# Patient Record
Sex: Male | Born: 2000 | Race: Black or African American | Hispanic: No | Marital: Single | Smoking: Never smoker
Health system: Southern US, Community
[De-identification: ages and names within clinical notes are randomized; demographics above are authoritative.]

## PROBLEM LIST (undated history)

## (undated) DIAGNOSIS — J302 Other seasonal allergic rhinitis: Secondary | ICD-10-CM

## (undated) HISTORY — PX: ADENOIDECTOMY: SUR15

## (undated) HISTORY — PX: OTHER SURGICAL HISTORY: SHX169

---
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
## 2020-12-07 ENCOUNTER — Encounter (HOSPITAL_COMMUNITY): Payer: Self-pay

## 2020-12-07 ENCOUNTER — Other Ambulatory Visit: Payer: Self-pay

## 2020-12-07 ENCOUNTER — Emergency Department (HOSPITAL_COMMUNITY)
Admission: EM | Admit: 2020-12-07 | Discharge: 2020-12-07 | Disposition: A | Discharge status: Home / Self Care | Payer: Medicaid Other | Attending: Emergency Medicine | Admitting: Emergency Medicine

## 2020-12-07 DIAGNOSIS — Z5321 Procedure and treatment not carried out due to patient leaving prior to being seen by health care provider: Secondary | ICD-10-CM | POA: Diagnosis not present

## 2020-12-07 DIAGNOSIS — K0889 Other specified disorders of teeth and supporting structures: Secondary | ICD-10-CM | POA: Diagnosis present

## 2020-12-07 NOTE — ED Triage Notes (Signed)
Patient c/o right upper dental pain x 3 days. No facial swelling.

## 2020-12-07 NOTE — ED Notes (Signed)
Pt ambulatory in ED lobby. 

## 2021-12-07 ENCOUNTER — Ambulatory Visit
Admission: EM | Admit: 2021-12-07 | Discharge: 2021-12-07 | Disposition: A | Discharge status: Home / Self Care | Payer: Medicaid Other | Attending: Physician Assistant | Admitting: Physician Assistant

## 2021-12-07 DIAGNOSIS — K0889 Other specified disorders of teeth and supporting structures: Secondary | ICD-10-CM

## 2021-12-07 DIAGNOSIS — K047 Periapical abscess without sinus: Secondary | ICD-10-CM | POA: Diagnosis not present

## 2021-12-07 MED ORDER — PENICILLIN V POTASSIUM 500 MG PO TABS: 500.0000 mg | ORAL_TABLET | Freq: Four times a day (QID) | ORAL | 0 refills | Status: AC

## 2021-12-07 MED ORDER — IBUPROFEN 600 MG PO TABS: 600.0000 mg | ORAL_TABLET | Freq: Four times a day (QID) | ORAL | 0 refills | Status: DC | PRN

## 2021-12-07 NOTE — ED Provider Notes (Signed)
[facility]    CSN: [id number] Arrival date & time: 12/07/21  1501      History   Chief Complaint Chief Complaint  Patient presents with   Dental Pain    HPI Dayron Odland is a 21 y.o. male.   21 year old male presents with tooth pain.  He Clearence Cheek for the past several days he has been having a right lower tooth pain, discomfort which is worse when he drinks hot and cold liquids.  He also relates he has pain when he tries to chew on the right side.  He indicates he feels like that the right side of his face is swelling.  He has been taking some BC powder like medication to give relief from the right tooth pain but this has not worked.  He relates he has not have any fever, chills, and no nausea or vomiting.   Dental Pain   Past Medical History:  Diagnosis Date   Asthma    Seasonal allergies     There are no problems to display for this patient.   Past Surgical History:  Procedure Laterality Date   ADENOIDECTOMY     tubes in ears         Home Medications    Prior to Admission medications   Medication Sig Start Date End Date Taking? Authorizing Provider  ibuprofen (ADVIL) 600 MG tablet Take 1 tablet (600 mg total) by mouth every 6 (six) hours as needed. 12/07/21  Yes Nyoka Lint, PA-C  penicillin v potassium (VEETID) 500 MG tablet Take 1 tablet (500 mg total) by mouth 4 (four) times daily for 10 days. 12/07/21 12/17/21 Yes Nyoka Lint, PA-C  albuterol (PROVENTIL HFA;VENTOLIN HFA) 108 (90 BASE) MCG/ACT inhaler Inhale 2 puffs into the lungs every 6 (six) hours as needed. For wheezing    [provider]  ibuprofen (ADVIL,MOTRIN) 400 MG tablet Take 1 tablet (400 mg total) by mouth every 6 (six) hours as needed for mild pain or moderate pain. 07/09/16   Waynetta Pean, PA-C  oseltamivir (TAMIFLU) 75 MG capsule Take 1 capsule (75 mg total) by mouth every 12 (twelve) hours. 03/31/17   Recardo Evangelist, PA-C    Family History Family History   Problem Relation Age of Onset   Asthma Mother     Social History Social History   Tobacco Use   Smoking status: Never   Smokeless tobacco: Never  Vaping Use   Vaping Use: Never used  Substance Use Topics   Alcohol use: No   Drug use: No     Allergies   Patient has no known allergies.   Review of Systems Review of Systems  HENT:  Positive for dental problem (right lower).      Physical Exam Triage Vital Signs ED Triage Vitals [12/07/21 1541]  Enc Vitals Group     BP 123/85     Pulse Rate 75     Resp 16     Temp 98 F (36.7 C)     Temp Source Oral     SpO2 98 %     Weight      Height      Head Circumference      Peak Flow      Pain Score 4     Pain Loc      Pain Edu?      Excl. in [place]?    No data found.  Updated Vital Signs BP 123/85 (BP Location: Left Arm)   Pulse 75  Temp 98 F (36.7 C) (Oral)   Resp 16   SpO2 98%   Visual Acuity Right Eye Distance:   Left Eye Distance:   Bilateral Distance:    Right Eye Near:   Left Eye Near:    Bilateral Near:     Physical Exam Constitutional:      Appearance: Normal appearance.  HENT:     Mouth/Throat:      Comments: Mouth: Tenderness is palpated along the wisdom tooth and next molar.  The wisdom tooth is encroaching on the space of the molar minimal swelling is present no active discharge noted.  Patient has pain on palpation of cheek area adjacent to the suspected tooth problem. Neurological:     Mental Status: He is alert.      UC Treatments / Results  Labs (all labs ordered are listed, but only abnormal results are displayed) Labs Reviewed - No data to display  EKG   Radiology No results found.  Procedures Procedures (including critical care time)  Medications Ordered in UC Medications - No data to display  Initial Impression / Assessment and Plan / UC Course  I have reviewed the triage vital signs and the nursing notes.  Pertinent labs & imaging results that were available  during my care of the patient were reviewed by me and considered in my medical decision making (see chart for details).    Plan: 1.  The dental abscess will be treated with the following: A.  Pen-Vee K 500 mg every 6 hours to treat infection. 2.  The dental pain will be treated with the following: A.  Ibuprofen 600 mg every 8 hours with food to help reduce pain. 3.  Patient advised to call [facility] dental services deceiving get an appointment to be evaluated. 4.  Advised follow-up PCP or return to urgent care if symptoms fail to improve. Final Clinical Impressions(s) / UC Diagnoses   Final diagnoses:  Pain, dental  Dental abscess     Discharge Instructions      Advised take ibuprofen 600 mg every 8 hours with food to help reduce pain. Advised take the Pen-Vee K 500 mg 4 times a day to treat possible infection. Advised to contact [facility] dental services to see if he get an appointment to see the dentist and have the pain and tooth addressed. Advised to follow-up with PCP or return to urgent care if symptoms fail to improve.    ED Prescriptions     Medication Sig Dispense Auth. Provider   penicillin v potassium (VEETID) 500 MG tablet Take 1 tablet (500 mg total) by mouth 4 (four) times daily for 10 days. 40 tablet Ellsworth Lennox, PA-C   ibuprofen (ADVIL) 600 MG tablet Take 1 tablet (600 mg total) by mouth every 6 (six) hours as needed. 30 tablet Ellsworth Lennox, PA-C      PDMP not reviewed this encounter.   Ellsworth Lennox, PA-C 12/07/21 1556

## 2021-12-07 NOTE — ED Triage Notes (Signed)
Pt c/o bottom right toothache onset ~ last night.

## 2021-12-07 NOTE — Discharge Instructions (Addendum)
Advised take ibuprofen 600 mg every 8 hours with food to help reduce pain. Advised take the Pen-Vee K 500 mg 4 times a day to treat possible infection. Advised to contact [facility] dental services to see if he get an appointment to see the dentist and have the pain and tooth addressed. Advised to follow-up with PCP or return to urgent care if symptoms fail to improve.

## 2022-01-26 ENCOUNTER — Ambulatory Visit
Admission: EM | Admit: 2022-01-26 | Discharge: 2022-01-26 | Disposition: A | Discharge status: Home / Self Care | Payer: Medicaid Other | Attending: Nurse Practitioner | Admitting: Nurse Practitioner

## 2022-01-26 DIAGNOSIS — Z1152 Encounter for screening for COVID-19: Secondary | ICD-10-CM | POA: Insufficient documentation

## 2022-01-26 DIAGNOSIS — R519 Headache, unspecified: Secondary | ICD-10-CM | POA: Insufficient documentation

## 2022-01-26 DIAGNOSIS — B349 Viral infection, unspecified: Secondary | ICD-10-CM | POA: Diagnosis not present

## 2022-01-26 DIAGNOSIS — J45909 Unspecified asthma, uncomplicated: Secondary | ICD-10-CM | POA: Insufficient documentation

## 2022-01-26 DIAGNOSIS — R058 Other specified cough: Secondary | ICD-10-CM | POA: Insufficient documentation

## 2022-01-26 LAB — RESP PANEL BY RT-PCR (FLU A&B, COVID) ARPGX2
Influenza A by PCR: NEGATIVE
Influenza B by PCR: NEGATIVE
SARS Coronavirus 2 by RT PCR: NEGATIVE

## 2022-01-26 NOTE — ED Provider Notes (Signed)
[facility]    CSN: [id number] Arrival date & time: 01/26/22  1528      History   Chief Complaint Chief Complaint  Patient presents with   Cough    HPI Troy Fuentes is a 21 y.o. male who presents for evaluation of cough. Pt reports one day of cough with HA, ST, and congestion.  He denies fevers, nausea/vomiting/diarrhea, shortness of breath.  He has a history of asthma.  Denies any wheezing or shortness of breath since onset of symptoms.  He does have rescue inhaler available if needed.  He thinks he is up-to-date on vaccines for COVID and flu.  He has been taking OTC headache powder for symptoms.  No known sick contacts.  No other concerns at this time.   Cough Associated symptoms: headaches and sore throat     Past Medical History:  Diagnosis Date   Asthma    Seasonal allergies     There are no problems to display for this patient.   Past Surgical History:  Procedure Laterality Date   ADENOIDECTOMY     tubes in ears         Home Medications    Prior to Admission medications   Medication Sig Start Date End Date Taking? Authorizing Provider  albuterol (PROVENTIL HFA;VENTOLIN HFA) 108 (90 BASE) MCG/ACT inhaler Inhale 2 puffs into the lungs every 6 (six) hours as needed. For wheezing    [provider]    Family History Family History  Problem Relation Age of Onset   Asthma Mother     Social History Social History   Tobacco Use   Smoking status: Never   Smokeless tobacco: Never  Vaping Use   Vaping Use: Never used  Substance Use Topics   Alcohol use: No   Drug use: No     Allergies   Patient has no known allergies.   Review of Systems Review of Systems  HENT:  Positive for congestion and sore throat.   Respiratory:  Positive for cough.   Neurological:  Positive for headaches.     Physical Exam Triage Vital Signs ED Triage Vitals  Enc Vitals Group     BP 01/26/22 1554 121/72     Pulse Rate 01/26/22 1554 74      Resp 01/26/22 1554 18     Temp 01/26/22 1554 99.4 F (37.4 C)     Temp Source 01/26/22 1554 Oral     SpO2 01/26/22 1554 97 %     Weight --      Height --      Head Circumference --      Peak Flow --      Pain Score 01/26/22 1555 4     Pain Loc --      Pain Edu? --      Excl. in [place]? --    No data found.  Updated Vital Signs BP 121/72 (BP Location: Left Arm)   Pulse 74   Temp 99.4 F (37.4 C) (Oral)   Resp 18   SpO2 97%   Visual Acuity Right Eye Distance:   Left Eye Distance:   Bilateral Distance:    Right Eye Near:   Left Eye Near:    Bilateral Near:     Physical Exam Vitals and nursing note reviewed.  Constitutional:      General: He is not in acute distress.    Appearance: Normal appearance. He is not ill-appearing or toxic-appearing.  HENT:     Head:  Normocephalic and atraumatic.     Right Ear: Tympanic membrane and ear canal normal.     Left Ear: Tympanic membrane and ear canal normal.     Nose: Congestion present.     Mouth/Throat:     Mouth: Mucous membranes are moist.     Pharynx: Posterior oropharyngeal erythema present.  Eyes:     Pupils: Pupils are equal, round, and reactive to light.  Cardiovascular:     Rate and Rhythm: Normal rate and regular rhythm.     Heart sounds: Normal heart sounds.  Pulmonary:     Effort: Pulmonary effort is normal.     Breath sounds: Normal breath sounds.  Musculoskeletal:     Cervical back: Normal range of motion and neck supple.  Lymphadenopathy:     Cervical: No cervical adenopathy.  Skin:    General: Skin is warm and dry.  Neurological:     General: No focal deficit present.     Mental Status: He is alert and oriented to person, place, and time.  Psychiatric:        Mood and Affect: Mood normal.        Behavior: Behavior normal.      UC Treatments / Results  Labs (all labs ordered are listed, but only abnormal results are displayed) Labs Reviewed  RESP PANEL BY RT-PCR (FLU A&B, COVID) [id number]     EKG   Radiology No results found.  Procedures Procedures (including critical care time)  Medications Ordered in UC Medications - No data to display  Initial Impression / Assessment and Plan / UC Course  I have reviewed the triage vital signs and the nursing notes.  Pertinent labs & imaging results that were available during my care of the patient were reviewed by me and considered in my medical decision making (see chart for details).     Discussed with patient viral illness and symptomatic treatment  OTC cough medication as needed Rest and fluids Follow up with PCP in 2-3 days for re-check  ER precautions reviewed and pt verbalized understanding  Final Clinical Impressions(s) / UC Diagnoses   Final diagnoses:  Viral illness     Discharge Instructions      Your symptoms and exam are consistent for a viral illness. Please treat your symptoms with over the counter cough medication, tylenol or ibuprofen, humidifier, and rest. Viral illnesses can last 7-14 days. Please follow up with your PCP if your symptoms are not improving. Please go to the ER for any worsening symptoms. This includes but is not limited to fever you can not control with tylenol or ibuprofen, you are not able to stay hydrated, you have shortness of breath or chest pain.  Thank you for choosing [facility] for your healthcare needs. I hope you feel better soon!    ED Prescriptions   None    PDMP not reviewed this encounter.   Radford Pax, NP 01/26/22 [phone]

## 2022-01-26 NOTE — Discharge Instructions (Signed)
Your symptoms and exam are consistent for a viral illness. Please treat your symptoms with over the counter cough medication, tylenol or ibuprofen, humidifier, and rest. Viral illnesses can last 7-14 days. Please follow up with your PCP if your symptoms are not improving. Please go to the ER for any worsening symptoms. This includes but is not limited to fever you can not control with tylenol or ibuprofen, you are not able to stay hydrated, you have shortness of breath or chest pain.  Thank you for choosing [facility] for your healthcare needs. I hope you feel better soon!  

## 2022-01-26 NOTE — ED Triage Notes (Signed)
Pt c/o cough, sore throat, and headache since yesterday. 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 Denies taking any meds.

## 2022-03-10 ENCOUNTER — Ambulatory Visit
Admission: EM | Admit: 2022-03-10 | Discharge: 2022-03-10 | Disposition: A | Discharge status: Home / Self Care | Payer: Medicaid Other | Attending: Urgent Care | Admitting: Urgent Care

## 2022-03-10 DIAGNOSIS — Z7251 High risk heterosexual behavior: Secondary | ICD-10-CM

## 2022-03-10 DIAGNOSIS — Z113 Encounter for screening for infections with a predominantly sexual mode of transmission: Secondary | ICD-10-CM | POA: Diagnosis not present

## 2022-03-10 NOTE — ED Provider Notes (Signed)
  [facility]  Note:  This document was prepared using Dragon voice recognition software and may include unintentional dictation errors.  MRN: [id number] DOB: 29-Jul-2000  Subjective:   Troy Fuentes is a 22 y.o. male presenting for STI screening. Has unprotected sex, 40 male partner currently.  Denies dysuria, hematuria, urinary frequency, penile discharge, penile swelling, testicular pain, testicular swelling, anal pain, groin pain.  No current facility-administered medications for this encounter.  Current Outpatient Medications:    albuterol (PROVENTIL HFA;VENTOLIN HFA) 108 (90 BASE) MCG/ACT inhaler, Inhale 2 puffs into the lungs every 6 (six) hours as needed. For wheezing, Disp: , Rfl:    No Known Allergies  Past Medical History:  Diagnosis Date   Asthma    Seasonal allergies      Past Surgical History:  Procedure Laterality Date   ADENOIDECTOMY     tubes in ears      Family History  Problem Relation Age of Onset   Asthma Mother     Social History   Tobacco Use   Smoking status: Never   Smokeless tobacco: Never  Vaping Use   Vaping Use: Never used  Substance Use Topics   Alcohol use: No   Drug use: No    ROS   Objective:   Vitals: BP 130/79 (BP Location: Left Arm)   Pulse 87   Temp 98.5 F (36.9 C) (Oral)   Resp 16   SpO2 98%   Physical Exam Constitutional:      General: He is not in acute distress.    Appearance: Normal appearance. He is well-developed and normal weight. He is not ill-appearing, toxic-appearing or diaphoretic.  HENT:     Head: Normocephalic and atraumatic.     Right Ear: External ear normal.     Left Ear: External ear normal.     Nose: Nose normal.     Mouth/Throat:     Pharynx: Oropharynx is clear.  Eyes:     General: No scleral icterus.       Right eye: No discharge.        Left eye: No discharge.     Extraocular Movements: Extraocular movements intact.  Cardiovascular:     Rate and Rhythm: Normal  rate.  Pulmonary:     Effort: Pulmonary effort is normal.  Musculoskeletal:     Cervical back: Normal range of motion.  Neurological:     Mental Status: He is alert and oriented to person, place, and time.  Psychiatric:        Mood and Affect: Mood normal.        Behavior: Behavior normal.        Thought Content: Thought content normal.        Judgment: Judgment normal.     Assessment and Plan :   PDMP not reviewed this encounter.  1. Screen for STD (sexually transmitted disease)   2. Unprotected sex     Labs pending, will treat as appropriate.    Jaynee Eagles, PA-C 03/10/22 1315

## 2022-03-10 NOTE — ED Triage Notes (Signed)
Pt is here for STD check as a preventative

## 2022-03-11 LAB — HIV ANTIBODY (ROUTINE TESTING W REFLEX): HIV Screen 4th Generation wRfx: NONREACTIVE

## 2022-03-11 LAB — RPR: RPR Ser Ql: NONREACTIVE

## 2022-03-13 LAB — CYTOLOGY, (ORAL, ANAL, URETHRAL) ANCILLARY ONLY
Chlamydia: NEGATIVE
Comment: NEGATIVE
Comment: NEGATIVE
Comment: NORMAL
Neisseria Gonorrhea: NEGATIVE
Trichomonas: NEGATIVE
